# Patient Record
Sex: Male | Born: 1955 | Race: Black or African American | Hispanic: No | Marital: Married | Smoking: Former smoker
Health system: Southern US, Community
[De-identification: ages and names within clinical notes are randomized; demographics above are authoritative.]

## PROBLEM LIST (undated history)

## (undated) DIAGNOSIS — Z5189 Encounter for other specified aftercare: Secondary | ICD-10-CM

## (undated) HISTORY — PX: SPINAL FUSION: SHX223

---
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
## 2003-10-01 ENCOUNTER — Emergency Department (HOSPITAL_COMMUNITY): Admission: EM | Admit: 2003-10-01 | Discharge: 2003-10-01 | Payer: Self-pay | Admitting: Family Medicine

## 2004-03-05 ENCOUNTER — Emergency Department (HOSPITAL_COMMUNITY): Admission: EM | Admit: 2004-03-05 | Discharge: 2004-03-05 | Payer: Self-pay | Admitting: Family Medicine

## 2004-03-07 ENCOUNTER — Emergency Department (HOSPITAL_COMMUNITY): Admission: EM | Admit: 2004-03-07 | Discharge: 2004-03-07 | Payer: Self-pay | Admitting: Family Medicine

## 2011-08-18 ENCOUNTER — Emergency Department (HOSPITAL_COMMUNITY)
Admission: EM | Admit: 2011-08-18 | Discharge: 2011-08-18 | Disposition: A | Discharge status: Nursing Home (Includes ICF) | Payer: Self-pay | Source: Home / Self Care | Attending: Emergency Medicine | Admitting: Emergency Medicine

## 2011-08-18 ENCOUNTER — Emergency Department (INDEPENDENT_AMBULATORY_CARE_PROVIDER_SITE_OTHER): Payer: Self-pay

## 2011-08-18 ENCOUNTER — Encounter (HOSPITAL_COMMUNITY): Payer: Self-pay | Admitting: Emergency Medicine

## 2011-08-18 DIAGNOSIS — S86819A Strain of other muscle(s) and tendon(s) at lower leg level, unspecified leg, initial encounter: Secondary | ICD-10-CM

## 2011-08-18 DIAGNOSIS — S838X9A Sprain of other specified parts of unspecified knee, initial encounter: Secondary | ICD-10-CM

## 2011-08-18 MED ORDER — IBUPROFEN 800 MG PO TABS: 800.0000 mg | ORAL_TABLET | Freq: Three times a day (TID) | ORAL | Status: AC | PRN

## 2011-08-18 MED ORDER — MORPHINE SULFATE 2 MG/ML IJ SOLN
4.0000 mg | Freq: Once | INTRAMUSCULAR | Status: AC
  Administered 2011-08-18: 4 mg via INTRAMUSCULAR

## 2011-08-18 MED ORDER — OXYCODONE-ACETAMINOPHEN 5-325 MG PO TABS: 1.0000 | ORAL_TABLET | Freq: Four times a day (QID) | ORAL | Status: AC | PRN

## 2011-08-18 MED ORDER — MORPHINE SULFATE 2 MG/ML IJ SOLN
INTRAMUSCULAR | Status: AC
  Filled 2011-08-18: qty 2

## 2011-08-18 NOTE — Discharge Instructions (Signed)
You must with a knee immobilizer and use crutches at all times until you evaluated by Dr. Lajoyce Corners. Continue icing it 20 minutes a time. Take the ibuprofen as written. You may take 1 g of Tylenol with it as well. This is an effective combination for pain. Take the Percocet only for severe pain. Do not take Percocet & the Tylenol, as they both have Tylenol in them, and too much can hurt your liver. Take no more than 4 g of Tylenol day. Return if you've a fever above 100.4, if your pain is uncontrolled with medications, or for any other concerns.

## 2011-08-18 NOTE — ED Notes (Signed)
Reports slipping on wet tile, tennis shoe slipped, knee twisted.  Patient makes it sound like knee was out of place, then put back in place.

## 2011-08-18 NOTE — ED Notes (Signed)
Patient and wife refuse knee immobilizer and crutches, reports having both items at home.  Spoke to dr [employer] clarifying patient decline of crutches and equipment.  Dr Chaney Malling aware of patient/family requests.  Patient understands the concerns of physician and staff with attempting ambulation with orthopedic supports.  Offered wheelchair to transfer to car, patient refused.

## 2011-08-18 NOTE — ED Provider Notes (Signed)
History     CSN: [id number]  Arrival date & time 08/18/11  1452   First MD Initiated Contact with Patient 08/18/11 1549      Chief Complaint  Patient presents with  . Knee Pain    (Consider location/radiation/quality/duration/timing/severity/associated sxs/prior treatment) HPI Comments: Patient has a history of patellar ligament laxity reports slipping, twisting his knee, and falling on it bends. States the patella came "out" laterally, and he pushed it back in, as he has done before. Now reports pain, swelling in his right knee, states that his kneecap is "higher than it should be" and soft tissue defect below the kneecap. He is unable to bend his knee. Is able to walk backwards. No injury to the hip, distal leg, ankle, foot. No nausea, vomiting, fevers. He is not on any medications. Has not been on any flora quinolone antibiotics recently.. Patient does not have any other medical problems.  ROS as noted in HPI. All other ROS negative.   Patient is a 56 y.o. male presenting with knee pain. The history is provided by the patient. No language interpreter was used.  Knee Pain This is a recurrent problem. The current episode started 3 to 5 hours ago. The problem occurs constantly. The problem has not changed since onset.The symptoms are aggravated by walking. The symptoms are relieved by nothing. He has tried nothing for the symptoms. The treatment provided no relief.    History reviewed. No pertinent past medical history.  Past Surgical History  Procedure Date  . Spinal fusion     No family history on file.  History  Substance Use Topics  . Smoking status: Former Games developer  . Smokeless tobacco: Not on file  . Alcohol Use: No      Review of Systems  Allergies  Review of patient's allergies indicates not on file.  Home Medications   Current Outpatient Rx  Name Route Sig Dispense Refill  . IBUPROFEN 800 MG PO TABS Oral Take 1 tablet (800 mg total) by mouth every 8 (eight)  hours as needed for pain or fever. 20 tablet 0  . OXYCODONE-ACETAMINOPHEN 5-325 MG PO TABS Oral Take 1-2 tablets by mouth every 6 (six) hours as needed for pain. 20 tablet 0    BP 115/66  Pulse 85  Temp(Src) 98.3 F (36.8 C) (Oral)  Resp 18  SpO2 100%  Physical Exam  Nursing note and vitals reviewed. Constitutional: He is oriented to person, place, and time. He appears well-developed and well-nourished.       Appears uncomfortable  HENT:  Head: Normocephalic and atraumatic.  Eyes: Conjunctivae and EOM are normal.  Neck: Normal range of motion.  Cardiovascular: Normal rate.   Pulmonary/Chest: Effort normal. No respiratory distress.  Abdominal: He exhibits no distension.  Musculoskeletal:       Right knee: He exhibits decreased range of motion, swelling, effusion and abnormal patellar mobility.        patient unable to tolerate any passive flexion of the knee. Patella displaced proximally. Inferior pole/ patellar tendon tenderness. Patella NT, Patellar apprehension test positive, Medial joint NT, Lateral joint NT, Popliteal region NT, Lachman's stable, Varus stress testing stable, Valgus stress testing stable, patient unable to tolerate Lachman's, McMurray's  Testing. Right hip, distal leg, ankle, foot normal.   Neurological: He is alert and oriented to person, place, and time.  Skin: Skin is warm and dry.  Psychiatric: He has a normal mood and affect. His behavior is normal.    ED Course  Procedures (  including critical care time)  Labs Reviewed - No data to display Dg Knee Complete 4 Views Right  08/18/2011  *RADIOLOGY REPORT*  Clinical Data: Larey Seat 3 hours ago and landed on right knee.  Pain. Unable to bend knee.  RIGHT KNEE - COMPLETE 4+ VIEW  Comparison: None.  Findings: There is no fracture or dislocation.  The patella appears high riding, with rounded soft tissue calcification of the inferior patellar tendon suggesting an old injury.  No visible effusion.  IMPRESSION: Question  chronic patellar tendon rupture.  No visible acute fracture or effusion.  Original Report Authenticated By: Elsie Stain, M.D.     1. Rupture patellar tendon     X-ray reviewed by myself. Proximal patellar displacement. No fracture. Osteophytes. Full report per radiologist  Dg Knee Complete 4 Views Right  08/18/2011  *RADIOLOGY REPORT*  Clinical Data: Larey Seat 3 hours ago and landed on right knee.  Pain. Unable to bend knee.  RIGHT KNEE - COMPLETE 4+ VIEW  Comparison: None.  Findings: There is no fracture or dislocation.  The patella appears high riding, with rounded soft tissue calcification of the inferior patellar tendon suggesting an old injury.  No visible effusion.  IMPRESSION: Question chronic patellar tendon rupture.  No visible acute fracture or effusion.  Original Report Authenticated By: Elsie Stain, M.D.    MDM  Suspect patellar tendon rupture. Obtaining x-ray. Patient unable to tolerate adequate exam.  giving 4 mg morphine, will reevaluate.  On reevaluation, patient states feels much better. He is able to bend and extend it about 20. Unable to tolerate any further range of motion. Patient declined knee immobilizer and crutches, states he has this at home. States that he will need to ice his knee, and wear the knee immobilizer at all times until he is evaluated by Dr. due to, orthopedist on call. Emphasized importance of followup. Sent patient home with a pronounced of his x-ray, and no CVA of his x-rays. Patient agrees with plan.  Luiz Blare, MD 08/18/11 [phone]

## 2011-08-24 ENCOUNTER — Encounter (HOSPITAL_COMMUNITY): Payer: Self-pay | Admitting: Anesthesiology

## 2011-08-24 ENCOUNTER — Other Ambulatory Visit (HOSPITAL_COMMUNITY): Payer: Self-pay | Admitting: Orthopedic Surgery

## 2011-08-24 ENCOUNTER — Ambulatory Visit (HOSPITAL_COMMUNITY): Payer: Self-pay | Admitting: Anesthesiology

## 2011-08-24 ENCOUNTER — Encounter (HOSPITAL_COMMUNITY): Payer: Self-pay | Admitting: *Deleted

## 2011-08-24 ENCOUNTER — Ambulatory Visit (HOSPITAL_COMMUNITY)
Admission: AD | Admit: 2011-08-24 | Discharge: 2011-08-25 | Disposition: A | Discharge status: Home / Self Care | Payer: Self-pay | Source: Ambulatory Visit | Attending: Orthopedic Surgery | Admitting: Orthopedic Surgery

## 2011-08-24 ENCOUNTER — Encounter (HOSPITAL_COMMUNITY)
Admission: AD | Disposition: A | Discharge status: Home / Self Care | Payer: Self-pay | Source: Ambulatory Visit | Attending: Orthopedic Surgery

## 2011-08-24 DIAGNOSIS — S86819A Strain of other muscle(s) and tendon(s) at lower leg level, unspecified leg, initial encounter: Secondary | ICD-10-CM

## 2011-08-24 DIAGNOSIS — M2390 Unspecified internal derangement of unspecified knee: Secondary | ICD-10-CM | POA: Insufficient documentation

## 2011-08-24 HISTORY — DX: Encounter for other specified aftercare: Z51.89

## 2011-08-24 HISTORY — PX: PATELLAR TENDON REPAIR: SHX737

## 2011-08-24 LAB — SURGICAL PCR SCREEN
MRSA, PCR: NEGATIVE
Staphylococcus aureus: NEGATIVE

## 2011-08-24 LAB — BASIC METABOLIC PANEL
BUN: 22 mg/dL (ref 6–23)
CO2: 27 mEq/L (ref 19–32)
Chloride: 106 mEq/L (ref 96–112)
GFR calc non Af Amer: >90 mL/min (ref >90)
Glucose, Bld: 91 mg/dL (ref 70–99)
Potassium: 4.2 mEq/L (ref 3.5–5.1)
Sodium: 141 mEq/L (ref 135–145)

## 2011-08-24 LAB — CBC
HCT: 42.4 % (ref 39.0–52.0)
Hemoglobin: 13.8 g/dL (ref 13.0–17.0)
RBC: 4.91 MIL/uL (ref 4.22–5.81)
WBC: 5 10*3/uL (ref 4.0–10.5)

## 2011-08-24 SURGERY — REPAIR, TENDON, PATELLAR
Anesthesia: General | Site: Knee | Laterality: Right | Wound class: Clean

## 2011-08-24 MED ORDER — METOCLOPRAMIDE HCL 10 MG PO TABS: 5.0000 mg | ORAL_TABLET | Freq: Three times a day (TID) | ORAL | Status: DC | PRN

## 2011-08-24 MED ORDER — PROPOFOL 10 MG/ML IV EMUL
INTRAVENOUS | Status: DC | PRN
  Administered 2011-08-24: 200 mg via INTRAVENOUS

## 2011-08-24 MED ORDER — OXYCODONE-ACETAMINOPHEN 5-325 MG PO TABS
1.0000 | ORAL_TABLET | ORAL | Status: DC | PRN
  Administered 2011-08-25 (×2): 2 via ORAL
  Filled 2011-08-24 (×2): qty 2

## 2011-08-24 MED ORDER — SODIUM CHLORIDE 0.9 % IV SOLN
INTRAVENOUS | Status: DC
  Administered 2011-08-24: 17:00:00 via INTRAVENOUS

## 2011-08-24 MED ORDER — LORAZEPAM 2 MG/ML IJ SOLN
1.0000 mg | Freq: Once | INTRAMUSCULAR | Status: DC | PRN
Start: 2011-08-24 — End: 2011-08-24

## 2011-08-24 MED ORDER — ONDANSETRON HCL 4 MG/2ML IJ SOLN: 4.0000 mg | Freq: Four times a day (QID) | INTRAMUSCULAR | Status: DC | PRN

## 2011-08-24 MED ORDER — ONDANSETRON HCL 4 MG/2ML IJ SOLN
INTRAMUSCULAR | Status: DC | PRN
  Administered 2011-08-24: 4 mg via INTRAVENOUS

## 2011-08-24 MED ORDER — BUPIVACAINE-EPINEPHRINE PF 0.5-1:200000 % IJ SOLN
INTRAMUSCULAR | Status: DC | PRN
  Administered 2011-08-24: 25 mL

## 2011-08-24 MED ORDER — HYDROMORPHONE HCL PF 1 MG/ML IJ SOLN: 0.5000 mg | INTRAMUSCULAR | Status: DC | PRN

## 2011-08-24 MED ORDER — HYDROCODONE-ACETAMINOPHEN 5-325 MG PO TABS
1.0000 | ORAL_TABLET | ORAL | Status: DC | PRN
Start: 2011-08-24 — End: 2011-08-25

## 2011-08-24 MED ORDER — MUPIROCIN 2 % EX OINT
TOPICAL_OINTMENT | CUTANEOUS | Status: AC
  Filled 2011-08-24: qty 22

## 2011-08-24 MED ORDER — METOCLOPRAMIDE HCL 5 MG/ML IJ SOLN: 5.0000 mg | Freq: Three times a day (TID) | INTRAMUSCULAR | Status: DC | PRN

## 2011-08-24 MED ORDER — MUPIROCIN 2 % EX OINT: TOPICAL_OINTMENT | Freq: Once | CUTANEOUS | Status: DC

## 2011-08-24 MED ORDER — CEFAZOLIN SODIUM 1-5 GM-% IV SOLN
1.0000 g | Freq: Four times a day (QID) | INTRAVENOUS | Status: AC
  Administered 2011-08-24 – 2011-08-25 (×3): 1 g via INTRAVENOUS
  Filled 2011-08-24 (×3): qty 50

## 2011-08-24 MED ORDER — CEFAZOLIN SODIUM 1-5 GM-% IV SOLN
INTRAVENOUS | Status: AC
  Filled 2011-08-24: qty 50

## 2011-08-24 MED ORDER — HYDROMORPHONE HCL PF 1 MG/ML IJ SOLN
0.2500 mg | INTRAMUSCULAR | Status: DC | PRN
  Administered 2011-08-24: 0.5 mg via INTRAVENOUS

## 2011-08-24 MED ORDER — MIDAZOLAM HCL 2 MG/2ML IJ SOLN
INTRAMUSCULAR | Status: AC
  Filled 2011-08-24: qty 2

## 2011-08-24 MED ORDER — FENTANYL CITRATE 0.05 MG/ML IJ SOLN
50.0000 ug | INTRAMUSCULAR | Status: DC | PRN
  Administered 2011-08-24: 100 ug via INTRAVENOUS

## 2011-08-24 MED ORDER — MIDAZOLAM HCL 2 MG/2ML IJ SOLN
1.0000 mg | INTRAMUSCULAR | Status: DC | PRN
  Administered 2011-08-24: 2 mg via INTRAVENOUS

## 2011-08-24 MED ORDER — ONDANSETRON HCL 4 MG PO TABS: 4.0000 mg | ORAL_TABLET | Freq: Four times a day (QID) | ORAL | Status: DC | PRN

## 2011-08-24 MED ORDER — 0.9 % SODIUM CHLORIDE (POUR BTL) OPTIME
TOPICAL | Status: DC | PRN
  Administered 2011-08-24: 1000 mL

## 2011-08-24 MED ORDER — FENTANYL CITRATE 0.05 MG/ML IJ SOLN
INTRAMUSCULAR | Status: AC
  Filled 2011-08-24: qty 2

## 2011-08-24 MED ORDER — LACTATED RINGERS IV SOLN
INTRAVENOUS | Status: DC | PRN
  Administered 2011-08-24: 15:00:00 via INTRAVENOUS

## 2011-08-24 MED ORDER — DEXAMETHASONE SODIUM PHOSPHATE 4 MG/ML IJ SOLN
INTRAMUSCULAR | Status: DC | PRN
  Administered 2011-08-24: 4 mg

## 2011-08-24 MED ORDER — FENTANYL CITRATE 0.05 MG/ML IJ SOLN
INTRAMUSCULAR | Status: DC | PRN
  Administered 2011-08-24: 25 ug via INTRAVENOUS
  Administered 2011-08-24 (×2): 50 ug via INTRAVENOUS
  Administered 2011-08-24: 25 ug via INTRAVENOUS

## 2011-08-24 MED ORDER — LACTATED RINGERS IV SOLN
INTRAVENOUS | Status: DC
  Administered 2011-08-24: 15:00:00 via INTRAVENOUS

## 2011-08-24 SURGICAL SUPPLY — 55 items
BANDAGE GAUZE ELAST BULKY 4 IN (GAUZE/BANDAGES/DRESSINGS) ×1 IMPLANT
BLADE SURG 10 STRL SS (BLADE) ×1 IMPLANT
BNDG CMPR 9X4 STRL LF SNTH (GAUZE/BANDAGES/DRESSINGS)
BNDG COHESIVE 6X5 TAN STRL LF (GAUZE/BANDAGES/DRESSINGS) ×4 IMPLANT
BNDG ESMARK 4X9 LF (GAUZE/BANDAGES/DRESSINGS) IMPLANT
BNDG GAUZE STRTCH 6 (GAUZE/BANDAGES/DRESSINGS) ×2 IMPLANT
CLOTH BEACON ORANGE TIMEOUT ST (SAFETY) ×2 IMPLANT
COTTON STERILE ROLL (GAUZE/BANDAGES/DRESSINGS) ×1 IMPLANT
CUFF TOURNIQUET SINGLE 34IN LL (TOURNIQUET CUFF) IMPLANT
CUFF TOURNIQUET SINGLE 44IN (TOURNIQUET CUFF) IMPLANT
DRAPE INCISE IOBAN 66X45 STRL (DRAPES) ×2 IMPLANT
DRAPE U-SHAPE 47X51 STRL (DRAPES) ×2 IMPLANT
DRSG ADAPTIC 3X8 NADH LF (GAUZE/BANDAGES/DRESSINGS) ×2 IMPLANT
DRSG PAD ABDOMINAL 8X10 ST (GAUZE/BANDAGES/DRESSINGS) ×1 IMPLANT
DURAPREP 26ML APPLICATOR (WOUND CARE) ×2 IMPLANT
ELECT REM PT RETURN 9FT ADLT (ELECTROSURGICAL) ×2
ELECTRODE REM PT RTRN 9FT ADLT (ELECTROSURGICAL) ×1 IMPLANT
GLOVE BIO SURGEON STRL SZ7 (GLOVE) ×1 IMPLANT
GLOVE BIOGEL PI IND STRL 6.5 (GLOVE) IMPLANT
GLOVE BIOGEL PI IND STRL 7.0 (GLOVE) IMPLANT
GLOVE BIOGEL PI IND STRL 9 (GLOVE) ×1 IMPLANT
GLOVE BIOGEL PI INDICATOR 6.5 (GLOVE) ×1
GLOVE BIOGEL PI INDICATOR 7.0 (GLOVE) ×1
GLOVE BIOGEL PI INDICATOR 9 (GLOVE) ×1
GLOVE SS BIOGEL STRL SZ 6.5 (GLOVE) IMPLANT
GLOVE SUPERSENSE BIOGEL SZ 6.5 (GLOVE) ×1
GLOVE SURG ORTHO 9.0 STRL STRW (GLOVE) ×2 IMPLANT
GOWN PREVENTION PLUS XLARGE (GOWN DISPOSABLE) ×1 IMPLANT
GOWN SRG XL XLNG 56XLVL 4 (GOWN DISPOSABLE) ×2 IMPLANT
GOWN STRL NON-REIN LRG LVL3 (GOWN DISPOSABLE) ×1 IMPLANT
GOWN STRL NON-REIN XL XLG LVL4 (GOWN DISPOSABLE) ×4
KIT ROOM TURNOVER OR (KITS) ×2 IMPLANT
MANIFOLD NEPTUNE II (INSTRUMENTS) ×2 IMPLANT
NDL SUT .5 MAYO 1.404X.05X (NEEDLE) ×1 IMPLANT
NEEDLE MAYO TAPER (NEEDLE) ×2
NS IRRIG 1000ML POUR BTL (IV SOLUTION) ×2 IMPLANT
PACK ORTHO EXTREMITY (CUSTOM PROCEDURE TRAY) ×2 IMPLANT
PAD ARMBOARD 7.5X6 YLW CONV (MISCELLANEOUS) ×3 IMPLANT
PASSER SUT SWANSON 36MM LOOP (INSTRUMENTS) ×1 IMPLANT
SPONGE GAUZE 4X4 12PLY (GAUZE/BANDAGES/DRESSINGS) ×2 IMPLANT
SPONGE LAP 18X18 X RAY DECT (DISPOSABLE) ×1 IMPLANT
SPONGE LAP 4X18 X RAY DECT (DISPOSABLE) ×2 IMPLANT
STAPLER VISISTAT 35W (STAPLE) ×1 IMPLANT
STOCKINETTE IMPERVIOUS 9X36 MD (GAUZE/BANDAGES/DRESSINGS) ×1 IMPLANT
SUT ETHILON 3 0 FSLX (SUTURE) ×1 IMPLANT
SUT FIBERWIRE #2 38 T-5 BLUE (SUTURE) ×4
SUT MNCRL AB 3-0 PS2 18 (SUTURE) ×2 IMPLANT
SUT VIC AB 0 CT1 27 (SUTURE) ×2
SUT VIC AB 0 CT1 27XBRD ANBCTR (SUTURE) IMPLANT
SUTURE FIBERWR #2 38 T-5 BLUE (SUTURE) ×4 IMPLANT
TOWEL OR 17X24 6PK STRL BLUE (TOWEL DISPOSABLE) ×2 IMPLANT
TOWEL OR 17X26 10 PK STRL BLUE (TOWEL DISPOSABLE) ×2 IMPLANT
TUBE CONNECTING 12X1/4 (SUCTIONS) ×2 IMPLANT
WATER STERILE IRR 1000ML POUR (IV SOLUTION) ×1 IMPLANT
YANKAUER SUCT BULB TIP NO VENT (SUCTIONS) ×2 IMPLANT

## 2011-08-24 NOTE — Anesthesia Procedure Notes (Addendum)
Anesthesia Regional Block:  Femoral nerve block  Pre-Anesthetic Checklist: ,, timeout performed, Correct Patient, Correct Site, Correct Laterality, Correct Procedure, Correct Position, site marked, Risks and benefits discussed,  Surgical consent,  Pre-op evaluation,  At surgeon's request and post-op pain management  Laterality: Right  Prep: chloraprep       Needles:  Injection technique: Single-shot  Needle Type: Stimulator Needle - 80     Needle Length: 8cm  Needle Gauge: 22 and 22 G    Additional Needles:  Procedures: nerve stimulator Femoral nerve block  Nerve Stimulator or Paresthesia:  Response: 0.48 mA,   Additional Responses:   Narrative:  Start time: 08/24/2011 2:45 PM End time: 08/24/2011 2:57 PM Injection made incrementally with aspirations every 5 mL. Anesthesiologist: Dr Gypsy Balsam  Additional Notes: [phone] R Fem N Block POP #22 stim needle w/ stim down to .48ma Multiple neg asp Marc .5% w/epi 1:200000 total 25cc also 4mg  decadron No compl Dr Gypsy Balsam     Procedure Name: LMA Insertion Date/Time: 08/24/2011 3:12 PM Performed by: Julianne Rice Z Pre-anesthesia Checklist: Patient identified, Timeout performed, Emergency Drugs available, Suction available and Patient being monitored Patient Re-evaluated:Patient Re-evaluated prior to inductionOxygen Delivery Method: Circle system utilized Preoxygenation: Pre-oxygenation with 100% oxygen Intubation Type: IV induction Ventilation: Mask ventilation without difficulty LMA: LMA inserted LMA Size: 4.0 Tube type: Oral Number of attempts: 1 Placement Confirmation: breath sounds checked- equal and bilateral and positive ETCO2 Tube secured with: Tape Dental Injury: Teeth and Oropharynx as per pre-operative assessment

## 2011-08-24 NOTE — H&P (Signed)
Greg Ward is an 56 y.o. male.   Chief Complaint: acute patella tendon rupture after patella dislocation right HPI: acute fall at work slipped in wet shoes and dislocated patella and ruptured tendon right knee  No past medical history on file.  Past Surgical History  Procedure Date  . Spinal fusion     No family history on file. Social History:  reports that he has quit smoking. He does not have any smokeless tobacco history on file. He reports that he does not drink alcohol or use illicit drugs.  Allergies: No Known Allergies  No prescriptions prior to admission    No results found for this or any previous visit (from the past 48 hour(s)). No results found.  Review of Systems  All other systems reviewed and are negative.    There were no vitals taken for this visit. Physical Exam  Palpable defect in patella tendon, patella midline, no active extension Assessment/Plan Patella tendon rupture right Plan for patella tendon reconstruction right  Oluwaferanmi Wain V 08/24/2011, 1:20 PM

## 2011-08-24 NOTE — Transfer of Care (Signed)
Immediate Anesthesia Transfer of Care Note  Patient: Greg Ward  Procedure(s) Performed: Procedure(s) (LRB): PATELLA TENDON REPAIR (Right)  Patient Location: PACU  Anesthesia Type: General  Level of Consciousness: awake and oriented  Airway & Oxygen Therapy: Patient Spontanous Breathing and Patient connected to nasal cannula oxygen  Post-op Assessment: Report given to PACU RN and Post -op Vital signs reviewed and stable  Post vital signs: Reviewed and stable  Complications: No apparent anesthesia complications

## 2011-08-24 NOTE — Op Note (Signed)
OPERATIVE REPORT  DATE OF SURGERY: 08/24/2011  PATIENT:  Greg Ward,  56 y.o. male  PRE-OPERATIVE DIAGNOSIS:  Ruptured PatellaTendon  POST-OPERATIVE DIAGNOSIS:  Ruptured PatellaTendon  PROCEDURE:  Procedure(s): PATELLA TENDON REPAIR  SURGEON:  Surgeon(s): Nadara Mustard, MD  ANESTHESIA:   regional and general  EBL:  min ML  SPECIMEN:  No Specimen  TOURNIQUET:  * No tourniquets in log *  PROCEDURE DETAILS: Patient is a 56 year old gentleman who slipped on a wet floor at work sustaining a hyperflexion injury to his right knee. The patella dislocated laterally had acute rupture of the patella tendon patient was immobilized and was seen in my office today for initial evaluation. He presents at this time for patellar tendon reconstruction for acute patella tendon rupture. Risks and benefits of surgery were discussed including infection neurovascular injury nonhealing of the tendon need for additional surgery. Patient states he understands and wished to proceed at this time. Description of procedure patient brought to or room for and underwent a general anesthetic after a femoral block. After adequate levels of anesthesia were obtained patient's right lower extremity was prepped using DuraPrep and draped into a sterile field. Midline incision was made over the patella and patella tendon. Examination showed complete rupture of the patella tendon off the patella. The wound was irrigated with normal saline using #2 fiber wire 2 of these sutures were braided through the patella tendon using a Krakw suture technique. 3 drill holes were then placed through the patella and the patella tendon was repaired to the superior pole of the patella. Patient had good range of motion after the reconstruction there was no gapping of the wound. The wound was again irrigated the subcutaneous is closed using #0 Vicryl and the skin was closed using staples. The wound was covered with Adaptic orthopedic sponges ABDs  dressing Kerlix and Coban patient was extubated taken to the PACU in stable condition plan for overnight observation.  PLAN OF CARE: Admit for overnight observation  PATIENT DISPOSITION:  PACU - hemodynamically stable.   Nadara Mustard, MD 08/24/2011 3:46 PM

## 2011-08-24 NOTE — Preoperative (Signed)
Beta Blockers   Reason not to administer Beta Blockers:Not Applicable 

## 2011-08-24 NOTE — Anesthesia Postprocedure Evaluation (Signed)
  Anesthesia Post-op Note  Patient: Greg Ward  Procedure(s) Performed: Procedure(s) (LRB): PATELLA TENDON REPAIR (Right)  Patient Location: PACU  Anesthesia Type: GA combined with regional for post-op pain  Level of Consciousness: awake, alert  and oriented  Airway and Oxygen Therapy: Patient Spontanous Breathing  Post-op Pain: none  Post-op Assessment: Post-op Vital signs reviewed, Patient's Cardiovascular Status Stable, Respiratory Function Stable, Patent Airway, No signs of Nausea or vomiting and Pain level controlled  Post-op Vital Signs: Reviewed and stable  Complications: No apparent anesthesia complications

## 2011-08-24 NOTE — Anesthesia Preprocedure Evaluation (Addendum)
Anesthesia Evaluation  Patient identified by MRN, date of birth, ID band Patient awake    Reviewed: Allergy & Precautions, H&P , NPO status , Patient's Chart, lab work & pertinent test results  Airway Mallampati: I TM Distance: >3 FB Neck ROM: Full    Dental   Pulmonary    Pulmonary exam normal       Cardiovascular     Neuro/Psych    GI/Hepatic   Endo/Other    Renal/GU      Musculoskeletal   Abdominal Normal abdominal exam  (+)   Peds  Hematology   Anesthesia Other Findings   Reproductive/Obstetrics                           Anesthesia Physical Anesthesia Plan  ASA: I  Anesthesia Plan: General   Post-op Pain Management:    Induction: Intravenous  Airway Management Planned: LMA  Additional Equipment:   Intra-op Plan:   Post-operative Plan: Extubation in OR  Informed Consent: I have reviewed the patients History and Physical, chart, labs and discussed the procedure including the risks, benefits and alternatives for the proposed anesthesia with the patient or authorized representative who has indicated his/her understanding and acceptance.     Plan Discussed with: CRNA and Surgeon  Anesthesia Plan Comments:         Anesthesia Quick Evaluation

## 2011-08-25 ENCOUNTER — Encounter (HOSPITAL_COMMUNITY): Payer: Self-pay | Admitting: Orthopedic Surgery

## 2011-08-25 MED ORDER — OXYCODONE-ACETAMINOPHEN 5-325 MG PO TABS: 1.0000 | ORAL_TABLET | ORAL | Status: AC | PRN

## 2011-08-25 MED FILL — Mupirocin Oint 2%: CUTANEOUS | Qty: 22 | Status: AC

## 2011-08-25 NOTE — Progress Notes (Signed)
Agree with evaluation and d/c of pt.  No PT follow.  08/25/2011 Cephus Shelling, PT, DPT [phone]

## 2011-08-25 NOTE — Discharge Instructions (Signed)
Keep right leg extended with the knee brace on at all times. Weightbearing as tolerated. Ice and elevation to decrease swelling.

## 2011-08-25 NOTE — Progress Notes (Signed)
Physical Therapy Evaluation Patient Details Name: Greg Ward MRN: [id number] DOB: Aug 21, 1955 Today's Date: 08/25/2011 Time: 4098-1191 PT Time Calculation (min): 31 min  PT Assessment / Plan / Recommendation Clinical Impression  Pt is a 56 y/o male admitted s/p R patellar tendon repair. Pt is progressing well. Ambulated >200 feet, with no AD by end of session. Tolerated stair training with crutches. Pt presents with no other PT needs, nor needs any follow up. Pt has all equipment. PT signing off.     PT Assessment  Patent does not need any further PT services    Follow Up Recommendations  No PT follow up    Barriers to Discharge        lEquipment Recommendations  None recommended by PT    Recommendations for Other Services     Frequency      Precautions / Restrictions Precautions Precautions: None Restrictions Weight Bearing Restrictions: Yes RLE Weight Bearing: Weight bearing as tolerated   Pertinent Vitals/Pain Pt reports a 1/10 pain at R knee. Pt repositioned and ice applied to R Knee.        Mobility  Bed Mobility Bed Mobility: Supine to Sit Supine to Sit: 5: Supervision Details for Bed Mobility Assistance: Cueing for sequence and R LE placement.  Transfers Transfers: Sit to Stand;Stand to Sit Sit to Stand: 5: Supervision Stand to Sit: 5: Supervision Details for Transfer Assistance: Cues for sequence and R LE placement.  Ambulation/Gait Ambulation/Gait Assistance: 5: Supervision Ambulation Distance (Feet): 220 Feet Assistive device: Rolling walker;Crutches;None Ambulation/Gait Assistance Details: Initally, max cues for gait pattern. Pt wanting to ER R LE and drag behind him. Cues for tall posture, pushing walker first, point toes toward center, R LE placement, and to push through both UE. Pt able to correct and be mod I and progress from RW, to crutches, to no AD throughout ambulation.  Gait Pattern: Step-to pattern;Step-through pattern;Trunk flexed Stairs:  Yes Stairs Assistance: 5: Supervision Stairs Assistance Details (indicate cue type and reason): Pt reports he has been using crutches at home for stairs. Only needing cues for crutch placement, R LE placement, and "up with good, down with bad." Stair Management Technique: No rails;With crutches Number of Stairs: 2  Wheelchair Mobility Wheelchair Mobility: No    Exercises     PT Diagnosis:    PT Problem List:   PT Treatment Interventions:     PT Goals    Visit Information  Last PT Received On: 08/25/11 Assistance Needed: +1    Subjective Data  Subjective: "I've been using the crutches."  Patient Stated Goal: Go home.    Prior Functioning  Home Living Lives With: Spouse Available Help at Discharge: Family Type of Home: House Home Access: Stairs to enter Secretary/administrator of Steps: 3 Entrance Stairs-Rails: None Home Layout: One level Bathroom Shower/Tub: Walk-in Contractor: Standard Bathroom Accessibility: Yes Home Adaptive Equipment: Crutches Prior Function Level of Independence: Independent Able to Take Stairs?: Yes Driving: Yes Vocation: Full time employment Communication Communication: No difficulties Dominant Hand: Right    Cognition  Overall Cognitive Status: Appears within functional limits for tasks assessed/performed Arousal/Alertness: Awake/alert Orientation Level: Appears intact for tasks assessed Behavior During Session: [facility] for tasks performed    Extremity/Trunk Assessment Right Upper Extremity Assessment RUE ROM/Strength/Tone: Within functional levels RUE Sensation: WFL - Light Touch RUE Coordination: WFL - gross/fine motor Left Upper Extremity Assessment LUE ROM/Strength/Tone: Within functional levels LUE Sensation: WFL - Light Touch LUE Coordination: WFL - gross/fine motor Right Lower Extremity  Assessment RLE ROM/Strength/Tone: WFL for tasks assessed RLE Sensation: WFL - Light Touch RLE Coordination: WFL -  gross/fine motor Left Lower Extremity Assessment LLE ROM/Strength/Tone: Within functional levels LLE Sensation: WFL - Light Touch LLE Coordination: WFL - gross/fine motor Trunk Assessment Trunk Assessment: Normal   Balance Balance Balance Assessed: No  End of Session PT - End of Session Equipment Utilized During Treatment: Gait belt Activity Tolerance: Patient tolerated treatment well Patient left: in chair;with call bell/phone within reach Nurse Communication: Mobility status;Weight bearing status   Oretha Ellis 08/25/2011, 12:06 PM

## 2011-08-25 NOTE — Discharge Summary (Signed)
Physician Discharge Summary  Patient ID: Greg Ward MRN: [id number] DOB/AGE: [birth year] 57 y.o.  Admit date: 08/24/2011 Discharge date: 08/25/2011  Admission Diagnoses: Right patellar tendon rupture. Discharge Diagnoses: Right patella tendon rupture. Active Problems:  * No active hospital problems. *    Discharged Condition: stable  Hospital Course: Patient's hospital course was essentially unremarkable. He underwent patella tendon reconstruction. He was placed in a Bledsoe brace was able to weight-bear and is discharged to home in stable condition.  Consults: None  Significant Diagnostic Studies routine laboratory studies  Treatments: surgery: See operative note  Discharge Exam: Blood pressure 130/73, pulse 70, temperature 98.3 F (36.8 C), temperature source Oral, resp. rate 18, height 6\' 1"  (1.854 m), weight 67.132 kg (148 lb), SpO2 98.00%. Incision/Wound: incision clean and dry at time of discharge.  Disposition: 04-Intermediate Care Facility  Discharge Orders    Future Orders Please Complete By Expires   Diet - low sodium heart healthy      Call MD / Call 911      Comments:   If you experience chest pain or shortness of breath, CALL 911 and be transported to the hospital emergency room.  If you develope a fever above 101 F, pus (white drainage) or increased drainage or redness at the wound, or calf pain, call your surgeon's office.   Constipation Prevention      Comments:   Drink plenty of fluids.  Prune juice may be helpful.  You may use a stool softener, such as Colace (over the counter) 100 mg twice a day.  Use MiraLax (over the counter) for constipation as needed.   Increase activity slowly as tolerated      Weight Bearing as taught in Physical Therapy      Comments:   Use a walker or crutches as instructed.     Medication List  As of 08/25/2011 11:36 AM   TAKE these medications         ibuprofen 800 MG tablet   Commonly known as: ADVIL,MOTRIN   Take 1  tablet (800 mg total) by mouth every 8 (eight) hours as needed for pain or fever.      oxyCODONE-acetaminophen 5-325 MG per tablet   Commonly known as: PERCOCET   Take 1-2 tablets by mouth every 6 (six) hours as needed for pain.      oxyCODONE-acetaminophen 5-325 MG per tablet   Commonly known as: PERCOCET   Take 1 tablet by mouth every 4 (four) hours as needed for pain.           Follow-up Information    Follow up with Tauri Ethington V, MD in 2 weeks.   Contact information:   [address] [place] [address] [phone]          Signed: Nadara Mustard 08/25/2011, 11:36 AM

## 2018-06-09 ENCOUNTER — Other Ambulatory Visit: Payer: Self-pay | Admitting: Internal Medicine

## 2018-06-09 ENCOUNTER — Ambulatory Visit
Admission: RE | Admit: 2018-06-09 | Discharge: 2018-06-09 | Disposition: A | Discharge status: Home / Self Care | Payer: No Typology Code available for payment source | Source: Ambulatory Visit | Attending: Internal Medicine | Admitting: Internal Medicine

## 2018-06-09 DIAGNOSIS — M4184 Other forms of scoliosis, thoracic region: Secondary | ICD-10-CM

## 2018-06-09 DIAGNOSIS — M25551 Pain in right hip: Secondary | ICD-10-CM

## 2018-06-09 DIAGNOSIS — M4186 Other forms of scoliosis, lumbar region: Secondary | ICD-10-CM

## 2019-11-20 DIAGNOSIS — M545 Low back pain: Secondary | ICD-10-CM | POA: Diagnosis not present

## 2019-11-28 DIAGNOSIS — L820 Inflamed seborrheic keratosis: Secondary | ICD-10-CM | POA: Diagnosis not present

## 2019-11-28 DIAGNOSIS — L280 Lichen simplex chronicus: Secondary | ICD-10-CM | POA: Diagnosis not present

## 2019-12-05 DIAGNOSIS — M545 Low back pain: Secondary | ICD-10-CM | POA: Diagnosis not present

## 2019-12-07 DIAGNOSIS — M545 Low back pain: Secondary | ICD-10-CM | POA: Diagnosis not present

## 2019-12-21 DIAGNOSIS — I10 Essential (primary) hypertension: Secondary | ICD-10-CM | POA: Diagnosis not present

## 2019-12-21 DIAGNOSIS — M4186 Other forms of scoliosis, lumbar region: Secondary | ICD-10-CM | POA: Diagnosis not present

## 2019-12-21 DIAGNOSIS — R7303 Prediabetes: Secondary | ICD-10-CM | POA: Diagnosis not present

## 2019-12-21 DIAGNOSIS — M4184 Other forms of scoliosis, thoracic region: Secondary | ICD-10-CM | POA: Diagnosis not present

## 2019-12-21 DIAGNOSIS — E785 Hyperlipidemia, unspecified: Secondary | ICD-10-CM | POA: Diagnosis not present

## 2020-04-10 ENCOUNTER — Encounter: Payer: Self-pay | Admitting: Orthopedic Surgery

## 2020-04-10 ENCOUNTER — Other Ambulatory Visit: Payer: Self-pay

## 2020-04-10 ENCOUNTER — Ambulatory Visit: Payer: Self-pay

## 2020-04-10 ENCOUNTER — Ambulatory Visit (INDEPENDENT_AMBULATORY_CARE_PROVIDER_SITE_OTHER): Payer: Medicare Other | Admitting: Orthopedic Surgery

## 2020-04-10 VITALS — Ht 71.0 in | Wt 144.0 lb

## 2020-04-10 DIAGNOSIS — M7651 Patellar tendinitis, right knee: Secondary | ICD-10-CM | POA: Diagnosis not present

## 2020-04-10 DIAGNOSIS — M25561 Pain in right knee: Secondary | ICD-10-CM

## 2020-04-10 DIAGNOSIS — G8929 Other chronic pain: Secondary | ICD-10-CM

## 2020-04-10 NOTE — Progress Notes (Signed)
Office Visit Note   Patient: Greg Ward           Date of Birth: 1955-09-12           MRN: [id number] Visit Date: 04/10/2020              Requested by: No referring provider defined for this encounter. PCP: Patient, No Pcp Per  Chief Complaint  Patient presents with  . Right Knee - Pain      HPI: Patient is a 64 year old gentleman who is status post patella tendon repair in May 2013.  Patient states he has been having some pain and swelling around the patella right knee for the past 6 months he denies any mechanical symptoms.  He states that topical CBD has been helpful as well as a compression sleeve.  Assessment & Plan: Visit Diagnoses:  1. Chronic pain of right knee   2. Patellar tendinitis, right knee     Plan: Patient symptoms are patellofemoral patient was given instructions for quad isometric strengthening and close chain kinetic exercises recommended Voltaren gel or CBD lotion.  Discussed the importance of quad strengthening.  Follow-Up Instructions: Return if symptoms worsen or fail to improve.   Ortho Exam  Patient is alert, oriented, no adenopathy, well-dressed, normal affect, normal respiratory effort. Examination patient has no effusion or redness of the right knee.  He has significant quad atrophy worse on the right than the left.  There is calcification lateral to the patella tendon the patella tendon is intact clinically he does not have patella alto.  He has pain around the patellofemoral joint the medial lateral joint line are nontender collaterals and cruciates are stable.  Imaging: XR KNEE 3 VIEW RIGHT  Result Date: 04/10/2020 Three-view radiographs of the right knee shows slight patella alto with calcification lateral to the patella tendon.  There is mild arthritic changes of both knees which is symmetrical.  No images are attached to the encounter.  Labs: No results found for: HGBA1C, ESRSEDRATE, CRP, LABURIC, REPTSTATUS, GRAMSTAIN, CULT,  LABORGA   No results found for: ALBUMIN, PREALBUMIN, LABURIC  No results found for: MG No results found for: VD25OH  No results found for: PREALBUMIN CBC EXTENDED Latest Ref Rng & Units 08/24/2011  WBC 4.0 - 10.5 K/uL 5.0  RBC 4.22 - 5.81 MIL/uL 4.91  HGB 13.0 - 17.0 g/dL 70.9  HCT 62.8 - 36.6 % 42.4  PLT 150 - 400 K/uL 262     Body mass index is 20.08 kg/m.  Orders:  Orders Placed This Encounter  Procedures  . XR KNEE 3 VIEW RIGHT   No orders of the defined types were placed in this encounter.    Procedures: No procedures performed  Clinical Data: No additional findings.  ROS:  All other systems negative, except as noted in the HPI. Review of Systems  Objective: Vital Signs: Ht 5\' 11"  (1.803 m)   Wt 144 lb (65.3 kg)   BMI 20.08 kg/m   Specialty Comments:  No specialty comments available.  PMFS History: There are no problems to display for this patient.  Past Medical History:  Diagnosis Date  . Blood transfusion 1974   with spinal fusion.     Family History  Problem Relation Age of Onset  . Anesthesia problems Neg Hx     Past Surgical History:  Procedure Laterality Date  . BACK SURGERY  1974   lumbar -thoracic .. fusion ...   . PATELLAR TENDON REPAIR  08/24/2011  Procedure: PATELLA TENDON REPAIR;  Surgeon: Nadara Mustard, MD;  Location: [facility] OR;  Service: Orthopedics;  Laterality: Right;  Ruptured Patella Tendon/ Right  . SPINAL FUSION     Social History   Occupational History  . Not on file  Tobacco Use  . Smoking status: Former Games developer  . Smokeless tobacco: Not on file  Substance and Sexual Activity  . Alcohol use: No  . Drug use: No  . Sexual activity: Yes

## 2020-06-13 DIAGNOSIS — M5136 Other intervertebral disc degeneration, lumbar region: Secondary | ICD-10-CM | POA: Diagnosis not present

## 2020-06-13 DIAGNOSIS — M4316 Spondylolisthesis, lumbar region: Secondary | ICD-10-CM | POA: Diagnosis not present

## 2020-06-13 DIAGNOSIS — M4125 Other idiopathic scoliosis, thoracolumbar region: Secondary | ICD-10-CM | POA: Diagnosis not present

## 2020-06-13 DIAGNOSIS — M532X6 Spinal instabilities, lumbar region: Secondary | ICD-10-CM | POA: Diagnosis not present

## 2020-06-20 ENCOUNTER — Other Ambulatory Visit: Payer: Self-pay | Admitting: Orthopaedic Surgery

## 2020-06-20 DIAGNOSIS — M4125 Other idiopathic scoliosis, thoracolumbar region: Secondary | ICD-10-CM

## 2020-07-07 ENCOUNTER — Ambulatory Visit
Admission: RE | Admit: 2020-07-07 | Discharge: 2020-07-07 | Disposition: A | Discharge status: Home / Self Care | Payer: Medicare Other | Source: Ambulatory Visit | Attending: Orthopaedic Surgery | Admitting: Orthopaedic Surgery

## 2020-07-07 ENCOUNTER — Other Ambulatory Visit: Payer: Self-pay

## 2020-07-07 DIAGNOSIS — M48061 Spinal stenosis, lumbar region without neurogenic claudication: Secondary | ICD-10-CM | POA: Diagnosis not present

## 2020-07-07 DIAGNOSIS — M4125 Other idiopathic scoliosis, thoracolumbar region: Secondary | ICD-10-CM

## 2020-07-07 DIAGNOSIS — M545 Low back pain, unspecified: Secondary | ICD-10-CM | POA: Diagnosis not present

## 2020-08-14 DIAGNOSIS — M5136 Other intervertebral disc degeneration, lumbar region: Secondary | ICD-10-CM | POA: Diagnosis not present

## 2020-08-14 DIAGNOSIS — M532X6 Spinal instabilities, lumbar region: Secondary | ICD-10-CM | POA: Diagnosis not present

## 2020-08-14 DIAGNOSIS — M4316 Spondylolisthesis, lumbar region: Secondary | ICD-10-CM | POA: Diagnosis not present

## 2020-08-14 DIAGNOSIS — M4125 Other idiopathic scoliosis, thoracolumbar region: Secondary | ICD-10-CM | POA: Diagnosis not present

## 2020-08-28 DIAGNOSIS — I10 Essential (primary) hypertension: Secondary | ICD-10-CM | POA: Diagnosis not present

## 2020-08-28 DIAGNOSIS — M4186 Other forms of scoliosis, lumbar region: Secondary | ICD-10-CM | POA: Diagnosis not present

## 2020-08-28 DIAGNOSIS — Z01818 Encounter for other preprocedural examination: Secondary | ICD-10-CM | POA: Diagnosis not present

## 2020-08-28 DIAGNOSIS — M4184 Other forms of scoliosis, thoracic region: Secondary | ICD-10-CM | POA: Diagnosis not present

## 2020-10-18 DIAGNOSIS — M5136 Other intervertebral disc degeneration, lumbar region: Secondary | ICD-10-CM | POA: Diagnosis not present

## 2020-10-18 DIAGNOSIS — M532X6 Spinal instabilities, lumbar region: Secondary | ICD-10-CM | POA: Diagnosis not present

## 2020-10-18 DIAGNOSIS — R7989 Other specified abnormal findings of blood chemistry: Secondary | ICD-10-CM | POA: Diagnosis not present

## 2020-10-18 DIAGNOSIS — M48062 Spinal stenosis, lumbar region with neurogenic claudication: Secondary | ICD-10-CM | POA: Diagnosis not present

## 2020-10-18 DIAGNOSIS — M4010 Other secondary kyphosis, site unspecified: Secondary | ICD-10-CM | POA: Diagnosis not present

## 2020-10-18 DIAGNOSIS — Z01812 Encounter for preprocedural laboratory examination: Secondary | ICD-10-CM | POA: Diagnosis not present

## 2020-10-18 DIAGNOSIS — M4125 Other idiopathic scoliosis, thoracolumbar region: Secondary | ICD-10-CM | POA: Diagnosis not present

## 2020-10-18 DIAGNOSIS — M4316 Spondylolisthesis, lumbar region: Secondary | ICD-10-CM | POA: Diagnosis not present

## 2020-11-04 DIAGNOSIS — Z87891 Personal history of nicotine dependence: Secondary | ICD-10-CM | POA: Diagnosis not present

## 2020-11-04 DIAGNOSIS — Z981 Arthrodesis status: Secondary | ICD-10-CM | POA: Diagnosis not present

## 2020-11-04 DIAGNOSIS — M4135 Thoracogenic scoliosis, thoracolumbar region: Secondary | ICD-10-CM | POA: Diagnosis not present

## 2020-11-04 DIAGNOSIS — M48062 Spinal stenosis, lumbar region with neurogenic claudication: Secondary | ICD-10-CM | POA: Diagnosis not present

## 2020-11-04 DIAGNOSIS — M4010 Other secondary kyphosis, site unspecified: Secondary | ICD-10-CM | POA: Diagnosis not present

## 2020-11-04 DIAGNOSIS — M4316 Spondylolisthesis, lumbar region: Secondary | ICD-10-CM | POA: Diagnosis not present

## 2020-11-04 DIAGNOSIS — Z9889 Other specified postprocedural states: Secondary | ICD-10-CM | POA: Diagnosis not present

## 2020-11-04 DIAGNOSIS — M4326 Fusion of spine, lumbar region: Secondary | ICD-10-CM | POA: Diagnosis not present

## 2020-11-04 DIAGNOSIS — M2548 Effusion, other site: Secondary | ICD-10-CM | POA: Diagnosis not present

## 2020-11-04 DIAGNOSIS — M4186 Other forms of scoliosis, lumbar region: Secondary | ICD-10-CM | POA: Diagnosis not present

## 2020-11-04 DIAGNOSIS — M419 Scoliosis, unspecified: Secondary | ICD-10-CM | POA: Diagnosis not present

## 2020-11-04 DIAGNOSIS — M5116 Intervertebral disc disorders with radiculopathy, lumbar region: Secondary | ICD-10-CM | POA: Diagnosis not present

## 2020-11-04 DIAGNOSIS — Z4889 Encounter for other specified surgical aftercare: Secondary | ICD-10-CM | POA: Diagnosis not present

## 2020-11-04 DIAGNOSIS — M4726 Other spondylosis with radiculopathy, lumbar region: Secondary | ICD-10-CM | POA: Diagnosis not present

## 2020-11-04 DIAGNOSIS — M4125 Other idiopathic scoliosis, thoracolumbar region: Secondary | ICD-10-CM | POA: Diagnosis not present

## 2020-11-04 DIAGNOSIS — M47816 Spondylosis without myelopathy or radiculopathy, lumbar region: Secondary | ICD-10-CM | POA: Diagnosis not present

## 2020-12-03 DIAGNOSIS — Z981 Arthrodesis status: Secondary | ICD-10-CM | POA: Diagnosis not present

## 2020-12-03 DIAGNOSIS — M4316 Spondylolisthesis, lumbar region: Secondary | ICD-10-CM | POA: Diagnosis not present

## 2020-12-03 DIAGNOSIS — M48062 Spinal stenosis, lumbar region with neurogenic claudication: Secondary | ICD-10-CM | POA: Diagnosis not present

## 2020-12-03 DIAGNOSIS — M4125 Other idiopathic scoliosis, thoracolumbar region: Secondary | ICD-10-CM | POA: Diagnosis not present

## 2021-01-29 DIAGNOSIS — M545 Low back pain, unspecified: Secondary | ICD-10-CM | POA: Diagnosis not present

## 2021-04-15 DIAGNOSIS — I10 Essential (primary) hypertension: Secondary | ICD-10-CM | POA: Diagnosis not present

## 2021-04-15 DIAGNOSIS — M4186 Other forms of scoliosis, lumbar region: Secondary | ICD-10-CM | POA: Diagnosis not present

## 2021-04-15 DIAGNOSIS — Z1211 Encounter for screening for malignant neoplasm of colon: Secondary | ICD-10-CM | POA: Diagnosis not present

## 2021-04-15 DIAGNOSIS — E785 Hyperlipidemia, unspecified: Secondary | ICD-10-CM | POA: Diagnosis not present

## 2021-04-15 DIAGNOSIS — Z1159 Encounter for screening for other viral diseases: Secondary | ICD-10-CM | POA: Diagnosis not present

## 2021-04-15 DIAGNOSIS — Z0001 Encounter for general adult medical examination with abnormal findings: Secondary | ICD-10-CM | POA: Diagnosis not present

## 2021-04-15 DIAGNOSIS — D72819 Decreased white blood cell count, unspecified: Secondary | ICD-10-CM | POA: Diagnosis not present

## 2021-04-15 DIAGNOSIS — Z136 Encounter for screening for cardiovascular disorders: Secondary | ICD-10-CM | POA: Diagnosis not present

## 2021-04-15 DIAGNOSIS — Z23 Encounter for immunization: Secondary | ICD-10-CM | POA: Diagnosis not present

## 2021-04-15 DIAGNOSIS — R7303 Prediabetes: Secondary | ICD-10-CM | POA: Diagnosis not present

## 2021-04-29 DIAGNOSIS — M4326 Fusion of spine, lumbar region: Secondary | ICD-10-CM | POA: Diagnosis not present

## 2021-04-29 DIAGNOSIS — M545 Low back pain, unspecified: Secondary | ICD-10-CM | POA: Diagnosis not present

## 2021-05-21 DIAGNOSIS — L309 Dermatitis, unspecified: Secondary | ICD-10-CM | POA: Diagnosis not present

## 2021-07-02 DIAGNOSIS — G8929 Other chronic pain: Secondary | ICD-10-CM | POA: Diagnosis not present

## 2021-07-02 DIAGNOSIS — I10 Essential (primary) hypertension: Secondary | ICD-10-CM | POA: Diagnosis not present

## 2022-06-12 DIAGNOSIS — Z0001 Encounter for general adult medical examination with abnormal findings: Secondary | ICD-10-CM | POA: Diagnosis not present

## 2022-06-12 DIAGNOSIS — Z23 Encounter for immunization: Secondary | ICD-10-CM | POA: Diagnosis not present

## 2022-06-12 DIAGNOSIS — R7303 Prediabetes: Secondary | ICD-10-CM | POA: Diagnosis not present

## 2022-06-12 DIAGNOSIS — M4186 Other forms of scoliosis, lumbar region: Secondary | ICD-10-CM | POA: Diagnosis not present

## 2022-06-12 DIAGNOSIS — D72819 Decreased white blood cell count, unspecified: Secondary | ICD-10-CM | POA: Diagnosis not present

## 2022-06-12 DIAGNOSIS — I10 Essential (primary) hypertension: Secondary | ICD-10-CM | POA: Diagnosis not present

## 2022-06-12 DIAGNOSIS — Z136 Encounter for screening for cardiovascular disorders: Secondary | ICD-10-CM | POA: Diagnosis not present

## 2022-06-12 DIAGNOSIS — Z1211 Encounter for screening for malignant neoplasm of colon: Secondary | ICD-10-CM | POA: Diagnosis not present

## 2022-10-22 IMAGING — MR MR LUMBAR SPINE W/O CM
4 of 6 series · 23 of 48 positions shown · non-contrast
Comparison: Radiographs of the lumbar spine 06/09/2018.

CLINICAL DATA: Other idiopathic scoliosis, thoracolumbar region.
Additional history provided by technologist: Patient reports low
back pain radiating into legs bilaterally, symptoms for 6 months,
history of Harrington rods in 2978.

EXAM:
MRI LUMBAR SPINE WITHOUT CONTRAST
TECHNIQUE: Multiplanar, multisequence MR imaging of the lumbar spine was
performed. No intravenous contrast was administered.

[Series 3: T2 · sagittal · 4.0mm · 0.55mm/px · 6 of 19 slices shown (1 of 3)]
[im 1/19]
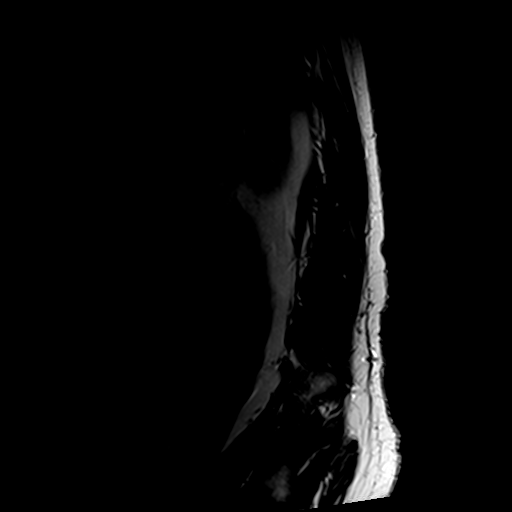
[im 4/19]
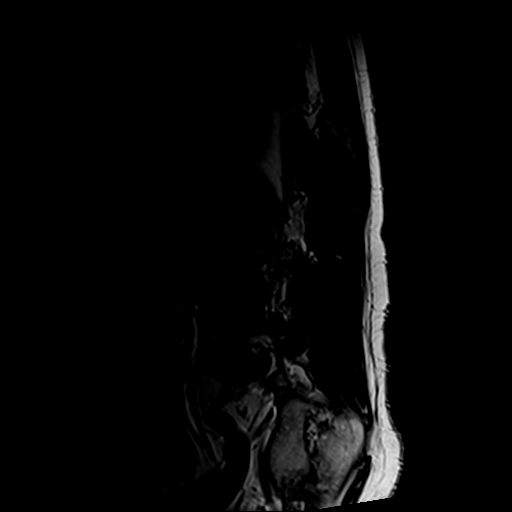
[im 8/19]
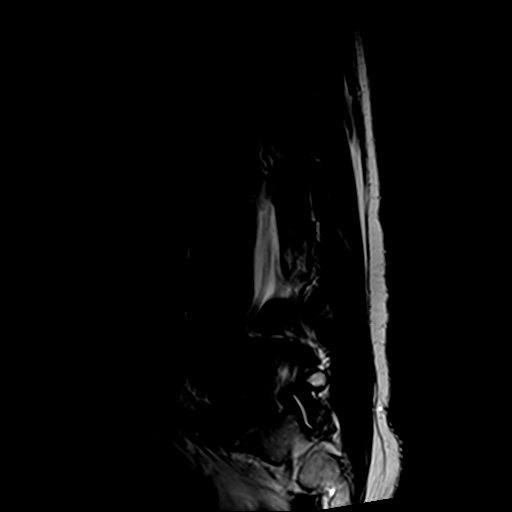
[im 11/19]
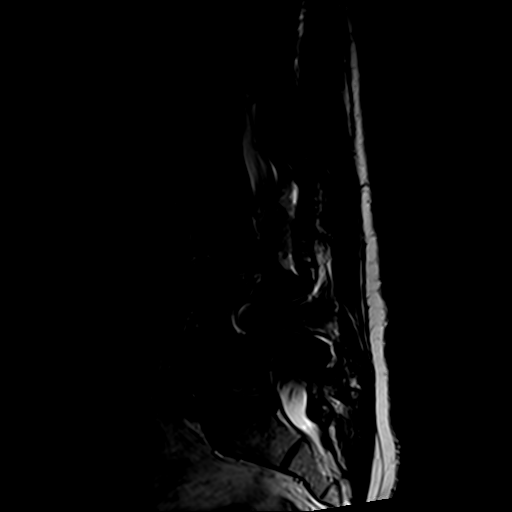
[im 15/19]
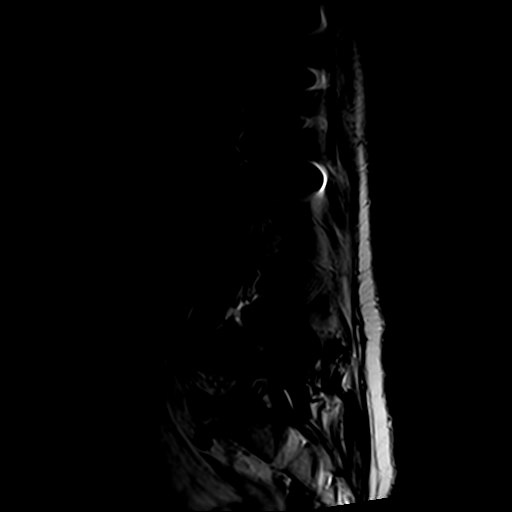
[im 19/19]
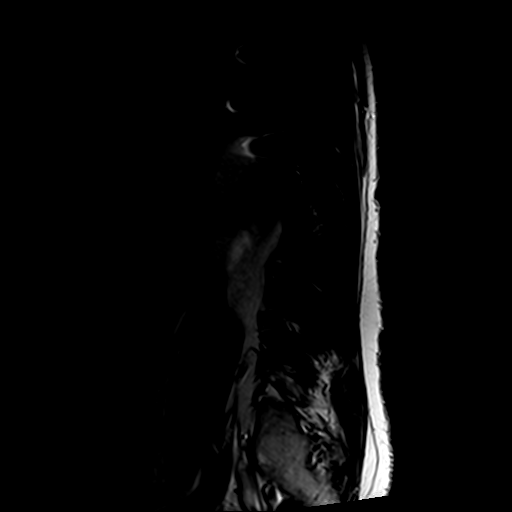

[Series 4: T1 · sagittal · 4.0mm · 0.55mm/px · 4 of 19 slices shown]
[im 1/19]
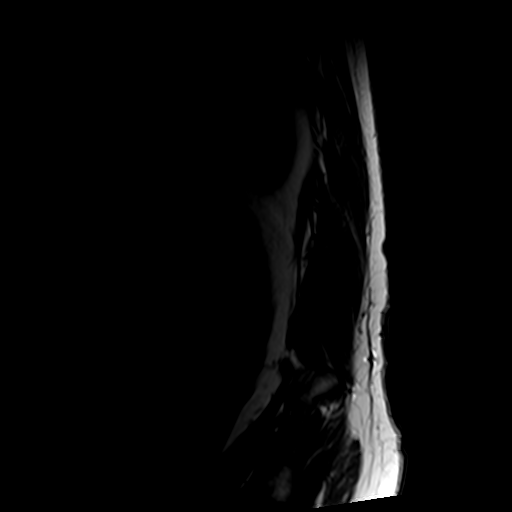
[im 4/19]
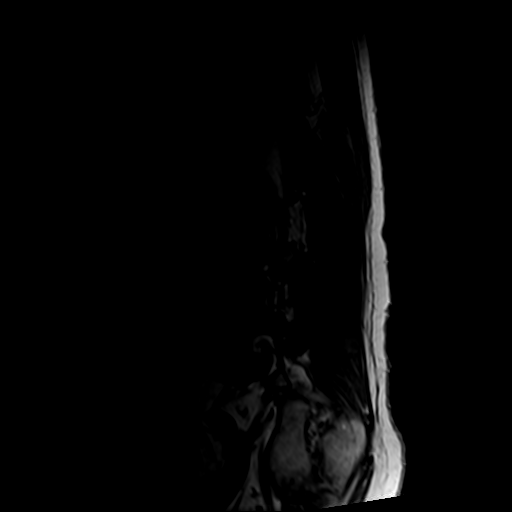
[im 11/19]
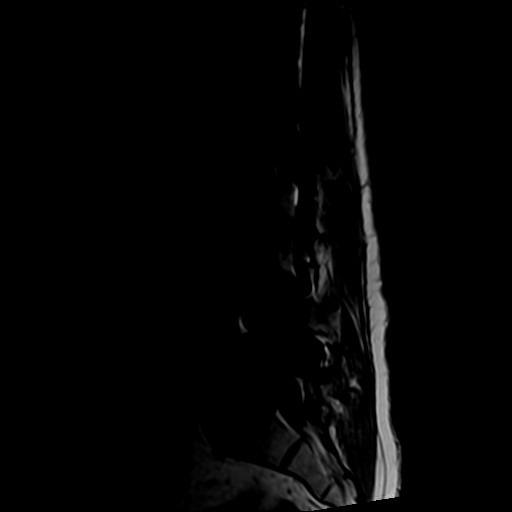
[im 19/19]
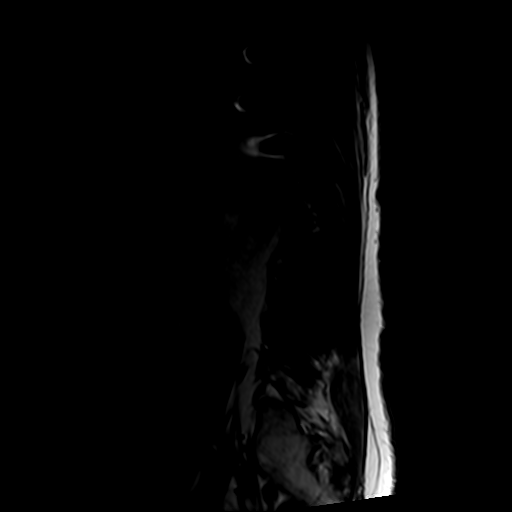

[Series 6: T2 · axial · 4.0mm · 0.70mm/px · z∈[-101,+106]mm · 9 of 40 slices shown (2 of 3)]
[im 1/40]
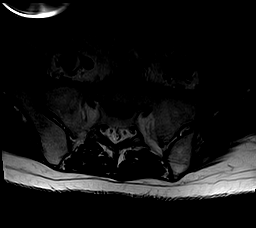
[im 7/40]
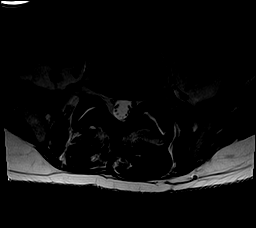
[im 14/40]
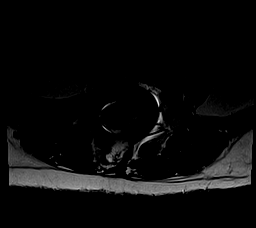
[im 17/40]
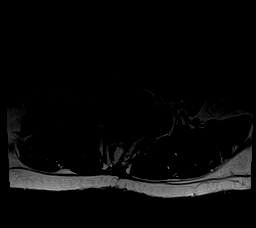
[im 20/40]
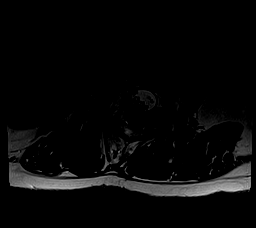
[im 23/40]
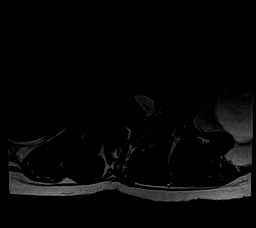
[im 27/40]
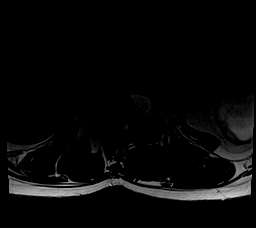
[im 33/40]
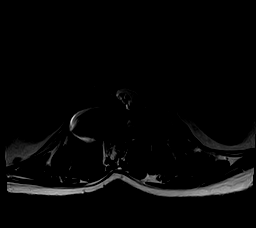
[im 40/40]
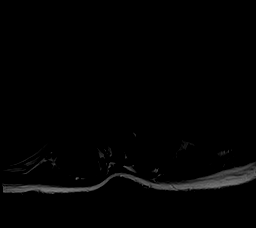

[Series 8: T2 · coronal · 4.0mm · 0.55mm/px · 4 of 13 slices shown (3 of 3)]
[im 1/13]
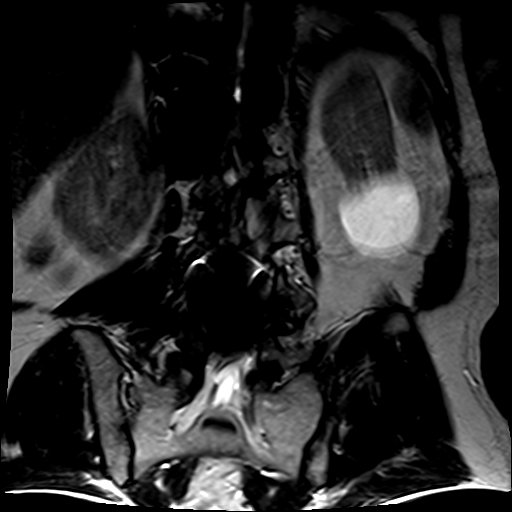
[im 5/13]
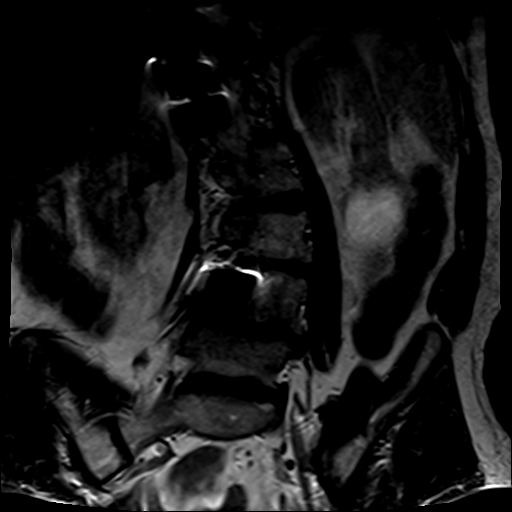
[im 9/13]
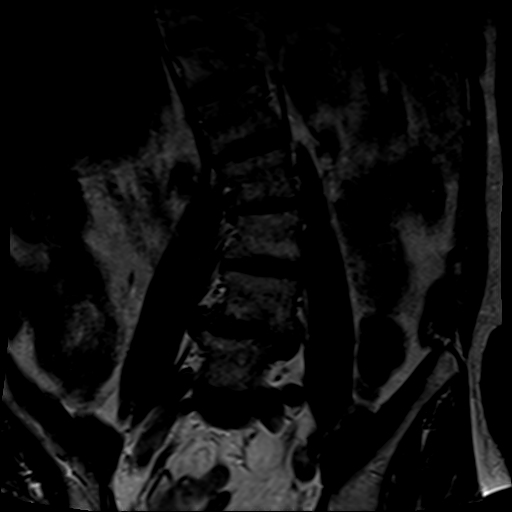
[im 13/13]
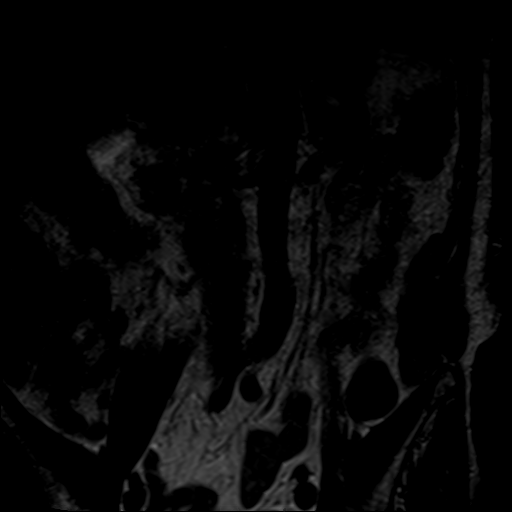

[23 of 48 positions shown; findings below may reference images not displayed]

FINDINGS: Segmentation: For the purposes of this dictation, five lumbar
vertebrae are assumed and the caudal most well-formed intervertebral
disc is designated L5-S1.

Alignment: Prominent levocurvature of the lower thoracic and lumbar
spine with apex at L2-L3. straightening of the expected lumbar
lordosis. L4-L5 grade 1 anterolisthesis. Mild L4-L5 left lateral
listhesis. Trace L5-S1 grade 1 retrolisthesis.

Vertebrae: Vertebral body height is maintained. Susceptibility
artifact arising from Harrington rods extending caudally to the L4
level. Trace degenerative endplate edema at L4-L5. No appreciable
significant marrow edema or focal suspicious osseous lesion
elsewhere.

Conus medullaris and cauda equina: Conus extends to the L1-L2 level.
No appreciable signal abnormality within the visualized distal
spinal cord.

Paraspinal and other soft tissues: Incompletely imaged left renal
cyst. Paraspinal soft tissues within normal limits.

Disc levels:

Multilevel disc degeneration. Most notably, there is moderate to
moderately advanced disc degeneration on the left at L4-L5.

T12-L1: Facet arthrosis. No significant disc herniation, spinal
canal stenosis or neural foraminal narrowing.

L1-L2: Facet arthrosis/ligamentum flavum hypertrophy. No significant
disc herniation, spinal canal stenosis or neural foraminal
narrowing.

L2-L3: Facet arthrosis. No significant disc herniation, spinal canal
stenosis or neural foraminal narrowing.

L3-L4: Slight disc bulge. Facet arthrosis. No significant spinal
canal or foraminal stenosis.

L4-L5: Grade 1 anterolisthesis. Disc uncovering with disc bulge and
endplate spurring. Superimposed left foraminal disc protrusion with
slight cranial migration (series 4, image 14). Advanced facet
arthrosis and ligamentum flavum hypertrophy. Left greater than right
facet joint effusions. Susceptibility artifact limits evaluation of
the spinal canal. However, there is bilateral subarticular stenosis
and suspected at least moderate central canal stenosis. The disc
protrusion contributes to moderate/severe left foraminal stenosis
and encroaches upon the exiting left L4 nerve root. Susceptibility
artifact obscures the right neural foramen.

L5-S1: Trace retrolisthesis. Disc bulge with endplate spurring.
Moderate facet arthrosis (greater on the left) with ligamentum
flavum hypertrophy. Small bilateral facet joint effusions.
Left-sided posteriorly projecting synovial facet cyst (series 3,
image 12). No significant spinal canal stenosis. Mild/moderate left
neural foraminal narrowing.
IMPRESSION: Prominent levocurvature of the lower thoracic and lumbar spine with
apex at L2-L3.

Susceptibility artifact arising from Harrington rods extending
caudally to the L4 level.

Lumbar spondylosis as outlined and with findings most notably as
follows.

At L4-L5, there is grade 1 anterolisthesis as well as mild left
lateral listhesis. Moderate to moderately advanced disc
degeneration. Disc uncovering with disc bulge and endplate spurring.
Superimposed left foraminal disc protrusion with slight cranial
migration. Advanced facet arthrosis and ligamentum flavum
hypertrophy. Left greater than right facet joint effusions.
Susceptibility artifact limits evaluation of the spinal canal.
However, there is bilateral subarticular stenosis and suspected at
least moderate central canal stenosis. The disc protrusion
contributes to moderate/severe left foraminal stenosis and
encroaches upon the exiting left L4 nerve root. Susceptibility
artifact obscures the right neural foramen.

At L5-S1, there is trace retrolisthesis. Disc bulge with endplate
spurring. Moderate facet arthrosis (greater on the left) with
ligamentum flavum hypertrophy. Small bilateral facet joint
effusions. Left-sided posteriorly projecting synovial facet cyst. No
significant spinal canal stenosis. Mild/moderate left neural
foraminal narrowing.

## 2022-12-15 DIAGNOSIS — H6123 Impacted cerumen, bilateral: Secondary | ICD-10-CM | POA: Diagnosis not present

## 2022-12-15 DIAGNOSIS — R7303 Prediabetes: Secondary | ICD-10-CM | POA: Diagnosis not present

## 2022-12-15 DIAGNOSIS — I10 Essential (primary) hypertension: Secondary | ICD-10-CM | POA: Diagnosis not present

## 2022-12-15 DIAGNOSIS — E785 Hyperlipidemia, unspecified: Secondary | ICD-10-CM | POA: Diagnosis not present

## 2022-12-15 DIAGNOSIS — M4186 Other forms of scoliosis, lumbar region: Secondary | ICD-10-CM | POA: Diagnosis not present
